# Patient Record
Sex: Female | Born: 1975 | Race: Black or African American | Hispanic: No | Marital: Married | Smoking: Never smoker
Health system: Southern US, Community
[De-identification: ages and names within clinical notes are randomized; demographics above are authoritative.]

## PROBLEM LIST (undated history)

## (undated) DIAGNOSIS — K219 Gastro-esophageal reflux disease without esophagitis: Secondary | ICD-10-CM

## (undated) DIAGNOSIS — E039 Hypothyroidism, unspecified: Secondary | ICD-10-CM

## (undated) DIAGNOSIS — E669 Obesity, unspecified: Secondary | ICD-10-CM

## (undated) HISTORY — DX: Gastro-esophageal reflux disease without esophagitis: K21.9

## (undated) HISTORY — PX: COLONOSCOPY: SHX174

## (undated) HISTORY — PX: LAPAROSCOPIC GASTRIC BANDING: SHX1100

---

## 2007-01-18 ENCOUNTER — Ambulatory Visit: Payer: Self-pay | Admitting: Family Medicine

## 2007-09-29 ENCOUNTER — Ambulatory Visit: Payer: Self-pay | Admitting: Gastroenterology

## 2007-11-03 ENCOUNTER — Ambulatory Visit: Payer: Self-pay | Admitting: Gastroenterology

## 2007-11-05 ENCOUNTER — Ambulatory Visit: Payer: Self-pay | Admitting: Gastroenterology

## 2007-11-05 LAB — CONVERTED CEMR LAB
Fecal Occult Blood: NEGATIVE
OCCULT 1: NEGATIVE
OCCULT 2: NEGATIVE
OCCULT 3: NEGATIVE
OCCULT 5: NEGATIVE

## 2007-12-27 ENCOUNTER — Ambulatory Visit: Payer: Self-pay | Admitting: Family Medicine

## 2008-02-04 DIAGNOSIS — D696 Thrombocytopenia, unspecified: Secondary | ICD-10-CM | POA: Insufficient documentation

## 2008-02-04 DIAGNOSIS — E039 Hypothyroidism, unspecified: Secondary | ICD-10-CM | POA: Insufficient documentation

## 2008-02-04 DIAGNOSIS — D649 Anemia, unspecified: Secondary | ICD-10-CM

## 2011-04-22 NOTE — Assessment & Plan Note (Signed)
[facility]                         GASTROENTEROLOGY OFFICE NOTE   NAME:Reid, Diane VEAZEY                    MRN:          [id number]  DATE:09/29/2007                            DOB:          Apr 09, 1976    REASON FOR CONSULTATION:  Anemia.   Diane Reid is a 35 year old African American female referred for  evaluation of anemia.  She is under Dr. Milta Deiters care for what I believe  is thrombocytopenia.  On August 27, 2007 her platelet count was  46,000, hemoglobin was 10, and MCV was 73.  Diane Reid denies rectal  bleeding or melena.  She had regular menstrual periods until  approximately 6 months ago.  They have regularized since starting an  oral contraceptive.  She takes occasional ibuprofen.  She denies  pyrosis, abdominal pain, or change in bowel habits.   PAST MEDICAL HISTORY:  Significant for hypothyroidism.   FAMILY HISTORY:  Noncontributory.   MEDICATIONS:  Include levothyroxine, and an oral contraceptive.   SHE HAS NO KNOWN ALLERGIES.   She neither smokes or drinks.  She is single, and works as a Armed forces operational officer.   REVIEW OF SYSTEMS:  Reviewed, and is negative.   EXAMINATION:  Pulse 80.  Blood pressure 122/80.  Weight 270.  HEENT: EOMI.  PERRLA.  Sclerae are anicteric.  Conjunctivae are pink.  NECK:  Supple without thyromegaly, adenopathy or carotid bruits.  CHEST:  Clear to auscultation and percussion without adventitious  sounds.  CARDIAC:  Regular rhythm; normal S1 S2.  There are no murmurs, gallops  or rubs.  ABDOMEN:  Bowel sounds are normoactive.  Abdomen is soft, nontender and  nondistended.  There are no abdominal masses, tenderness, splenic  enlargement or hepatomegaly.  EXTREMITIES:  Full range of motion.  No cyanosis, clubbing or edema.  RECTAL:  Deferred.   IMPRESSION:  1. Microcytic anemia.  Etiology could be from chronic GI blood loss,      menstrual blood loss, or malabsorption.  2. Thrombocytopenia.   ITP certainly is a possibility.  Details of her      workup with Dr. Welton Flakes are not known.  3. Hypothyroidism.   RECOMMENDATION:  1. Colonoscopy.  2. Serial hemoccults.  3. Consider upper endoscopy with small bowel biopsies if she is      Hemoccult, and colonoscopy is negative.     Barbette Hair. Arlyce Dice, MD,FACG  Electronically Signed    RDK/MedQ  DD: 09/29/2007  DT: 09/30/2007  Job #: [id number]   cc:   Beverely Risen, M.D.

## 2011-04-22 NOTE — Letter (Signed)
September 29, 2007    Ms. Diane Reid   RE:  [name], [name]  MRN:  [id number]  /  DOB:  [birth year]   Dear Ms. Carrasco:   It is my pleasure to have treated you recently as a new patient in my  office.  I appreciate your confidence and the opportunity to participate  in your care.   Since I do have a busy inpatient endoscopy schedule and office schedule,  my office hours vary weekly.  I am, however, available for emergency  calls every day through my office.  If I cannot promptly meet an urgent  office appointment, another one of our gastroenterologists will be able  to assist you.   My well-trained staff are prepared to help you at all times.  For  emergencies after office hours, a physician from our gastroenterology  section is always available through my 24-hour answering service.   While you are under my care, I encourage discussion of your questions  and concerns, and I will be happy to return your calls as soon as I am  available.   Once again, I welcome you as a new patient and I look forward to a happy  and healthy relationship.    Sincerely,      Barbette Hair. Arlyce Dice, MD,FACG  Electronically Signed   RDK/MedQ  DD: 09/29/2007  DT: 09/30/2007  Job #: [id number]

## 2014-01-15 ENCOUNTER — Ambulatory Visit: Payer: Self-pay | Admitting: Physician Assistant

## 2014-01-15 LAB — URINALYSIS, COMPLETE
BILIRUBIN, UR: NEGATIVE
Glucose,UR: NEGATIVE mg/dL (ref 0–75)
KETONE: NEGATIVE
NITRITE: NEGATIVE
Ph: 6 (ref 4.5–8.0)
RBC,UR: >30 /HPF (ref 0–5)
Specific Gravity: 1.02 (ref 1.003–1.030)

## 2014-01-17 LAB — URINE CULTURE

## 2015-07-11 ENCOUNTER — Encounter: Payer: Self-pay | Admitting: Gastroenterology

## 2016-01-05 ENCOUNTER — Ambulatory Visit
Admission: EM | Admit: 2016-01-05 | Discharge: 2016-01-05 | Disposition: A | Discharge status: Home / Self Care | Payer: 59 | Attending: Family Medicine | Admitting: Family Medicine

## 2016-01-05 DIAGNOSIS — J029 Acute pharyngitis, unspecified: Secondary | ICD-10-CM

## 2016-01-05 DIAGNOSIS — H109 Unspecified conjunctivitis: Secondary | ICD-10-CM

## 2016-01-05 DIAGNOSIS — J069 Acute upper respiratory infection, unspecified: Secondary | ICD-10-CM

## 2016-01-05 HISTORY — DX: Obesity, unspecified: E66.9

## 2016-01-05 HISTORY — DX: Hypothyroidism, unspecified: E03.9

## 2016-01-05 LAB — RAPID STREP SCREEN (MED CTR MEBANE ONLY): Streptococcus, Group A Screen (Direct): NEGATIVE

## 2016-01-05 MED ORDER — BENZONATATE 200 MG PO CAPS: 200.0000 mg | ORAL_CAPSULE | Freq: Three times a day (TID) | ORAL | Status: DC | PRN

## 2016-01-05 MED ORDER — GENTAMICIN SULFATE 0.3 % OP SOLN: 2.0000 [drp] | Freq: Three times a day (TID) | OPHTHALMIC | Status: DC

## 2016-01-05 NOTE — ED Provider Notes (Signed)
CSN: [id number]     Arrival date & time 01/05/16  0908 History   First MD Initiated Contact with Patient 01/05/16 1013    Nurses notes were reviewed. Chief Complaint  Patient presents with  . Conjunctivitis    Eye puffy, itchy yesterday, crusted shut this a.m. Throat pain x past few days. 3/10 pain.    Patient is here because of irritation of her left eye. She states that yesterday became red irritated inflamed so she came in to be seen. She started having a sore throat on Wednesday basically 3 days before that. And she's also had a dry cough at night.   History of hypothyroidism she does not smoke she's had gastric banding past/banding for obesity. No known history of drug allergies no sniffing family medical history or problems.    (Consider location/radiation/quality/duration/timing/severity/associated sxs/prior Treatment) Patient is a 40 y.o. female presenting with conjunctivitis and pharyngitis. The history is provided by the patient. No language interpreter was used.  Conjunctivitis This is a new problem. The current episode started yesterday. The problem occurs constantly. The problem has been gradually worsening. Pertinent negatives include no chest pain, no abdominal pain, no headaches and no shortness of breath. Nothing relieves the symptoms. She has tried nothing for the symptoms.  Sore Throat This is a new problem. The current episode started more than 2 days ago. The problem occurs constantly. The problem has not changed since onset.Pertinent negatives include no chest pain, no abdominal pain, no headaches and no shortness of breath. Nothing relieves the symptoms. She has tried nothing for the symptoms.    Past Medical History  Diagnosis Date  . Hypothyroid   . Obesity    Past Surgical History  Procedure Laterality Date  . Laparoscopic gastric banding     History reviewed. No pertinent family history. Social History  Substance Use Topics  . Smoking status: Never Smoker    . Smokeless tobacco: None  . Alcohol Use: No   OB History    No data available     Review of Systems  Respiratory: Positive for cough. Negative for shortness of breath.   Cardiovascular: Negative for chest pain.  Gastrointestinal: Negative for abdominal pain.  Neurological: Negative for headaches.  All other systems reviewed and are negative.   Allergies  Review of patient's allergies indicates no known allergies.  Home Medications   Prior to Admission medications   Medication Sig Start Date End Date Taking? Authorizing Provider  levothyroxine (SYNTHROID, LEVOTHROID) 50 MCG tablet Take 50 mcg by mouth daily before breakfast.   Yes Historical Provider, MD  norethindrone-ethinyl estradiol-iron (MICROGESTIN FE,GILDESS FE,LOESTRIN FE) 1.5-30 MG-MCG tablet Take 1 tablet by mouth daily.   Yes Historical Provider, MD  benzonatate (TESSALON) 200 MG capsule Take 1 capsule (200 mg total) by mouth 3 (three) times daily as needed for cough. 01/05/16   Frederich Cha, MD  gentamicin (GARAMYCIN) 0.3 % ophthalmic solution Place 2 drops into the left eye 3 (three) times daily. 01/05/16   Frederich Cha, MD   Meds Ordered and Administered this Visit  Medications - No data to display  BP 131/76 mmHg  Pulse 94  Temp(Src) 98.1 F (36.7 C) (Oral)  Resp 20  Ht 5\' 4"  (1.626 m)  Wt 234 lb (106.142 kg)  BMI 40.15 kg/m2  SpO2 100%  LMP 12/13/2015 No data found.   Physical Exam  ED Course  Procedures (including critical care time)  Labs Review Labs Reviewed  RAPID STREP SCREEN (NOT AT [facility])  CULTURE,  GROUP A STREP [facility])    Imaging Review No results found.   Visual Acuity Review  Right Eye Distance: 20/30 uncorrected Left Eye Distance: 20/30 uncorrected Bilateral Distance: 20/25 uncorrected  Right Eye Near:   Left Eye Near:    Bilateral Near:       Results for orders placed or performed during the hospital encounter of 01/05/16  Rapid strep screen  Result Value Ref Range    Streptococcus, Group A Screen (Direct) NEGATIVE NEGATIVE    MDM   1. Conjunctivitis of left eye   2. URI, acute   3. Pharyngitis    We'll place patient on gentamicin eyedrops 2 drops in the affected left eye 3 times a day Tessalon Perles for cough return for follow-up symptoms get worse. Note: This dictation was prepared with Dragon dictation along with smaller phrase technology. Any transcriptional errors that result from this process are unintentional.   Frederich Cha, MD 01/05/16 1032

## 2016-01-05 NOTE — Discharge Instructions (Signed)
Bacterial Conjunctivitis Bacterial conjunctivitis (commonly called pink eye) is redness, soreness, or puffiness (inflammation) of the white part of your eye. It is caused by a germ called bacteria. These germs can easily spread from person to person (contagious). Your eye often will become red or pink. Your eye may also become irritated, watery, or have a thick discharge.  HOME CARE   Apply a cool, clean washcloth over closed eyelids. Do this for 10-20 minutes, 3-4 times a day while you have pain.  Gently wipe away any fluid coming from the eye with a warm, wet washcloth or cotton ball.  Wash your hands often with soap and water. Use paper towels to dry your hands.  Do not share towels or washcloths.  Change or wash your pillowcase every day.  Do not use eye makeup until the infection is gone.  Do not use machines or drive if your vision is blurry.  Stop using contact lenses. Do not use them again until your doctor says it is okay.  Do not touch the tip of the eye drop bottle or medicine tube with your fingers when you put medicine on the eye. GET HELP RIGHT AWAY IF:   Your eye is not better after 3 days of starting your medicine.  You have a yellowish fluid coming out of the eye.  You have more pain in the eye.  Your eye redness is spreading.  Your vision becomes blurry.  You have a fever or lasting symptoms for more than 2-3 days.  You have a fever and your symptoms suddenly get worse.  You have pain in the face.  Your face gets red or puffy (swollen). MAKE SURE YOU:   Understand these instructions.  Will watch this condition.  Will get help right away if you are not doing well or get worse.   This information is not intended to replace advice given to you by your health care provider. Make sure you discuss any questions you have with your health care provider.   Document Released: 09/02/2008 Document Revised: 11/10/2012 Document Reviewed: 07/30/2012 Elsevier  Interactive Patient Education 2016 Elsevier Inc.  Cough, Adult A cough helps to clear your throat and lungs. A cough may last only 2-3 weeks (acute), or it may last longer than 8 weeks (chronic). Many different things can cause a cough. A cough may be a sign of an illness or another medical condition. HOME CARE  Pay attention to any changes in your cough.  Take medicines only as told by your doctor.  If you were prescribed an antibiotic medicine, take it as told by your doctor. Do not stop taking it even if you start to feel better.  Talk with your doctor before you try using a cough medicine.  Drink enough fluid to keep your pee (urine) clear or pale yellow.  If the air is dry, use a cold steam vaporizer or humidifier in your home.  Stay away from things that make you cough at work or at home.  If your cough is worse at night, try using extra pillows to raise your head up higher while you sleep.  Do not smoke, and try not to be around smoke. If you need help quitting, ask your doctor.  Do not have caffeine.  Do not drink alcohol.  Rest as needed. GET HELP IF:  You have new problems (symptoms).  You cough up yellow fluid (pus).  Your cough does not get better after 2-3 weeks, or your cough gets worse.  Medicine does not help your cough and you are not sleeping well.  You have pain that gets worse or pain that is not helped with medicine.  You have a fever.  You are losing weight and you do not know why.  You have night sweats. GET HELP RIGHT AWAY IF:  You cough up blood.  You have trouble breathing.  Your heartbeat is very fast.   This information is not intended to replace advice given to you by your health care provider. Make sure you discuss any questions you have with your health care provider.   Document Released: 08/07/2011 Document Revised: 08/15/2015 Document Reviewed: 01/31/2015 Elsevier Interactive Patient Education 2016 Elsevier  Inc.  Pharyngitis Pharyngitis is a sore throat (pharynx). There is redness, pain, and swelling of your throat. HOME CARE   Drink enough fluids to keep your pee (urine) clear or pale yellow.  Only take medicine as told by your doctor.  You may get sick again if you do not take medicine as told. Finish your medicines, even if you start to feel better.  Do not take aspirin.  Rest.  Rinse your mouth (gargle) with salt water ( tsp of salt per 1 qt of water) every 1-2 hours. This will help the pain.  If you are not at risk for choking, you can suck on hard candy or sore throat lozenges. GET HELP IF:  You have large, tender lumps on your neck.  You have a rash.  You cough up green, yellow-brown, or bloody spit. GET HELP RIGHT AWAY IF:   You have a stiff neck.  You drool or cannot swallow liquids.  You throw up (vomit) or are not able to keep medicine or liquids down.  You have very bad pain that does not go away with medicine.  You have problems breathing (not from a stuffy nose). MAKE SURE YOU:   Understand these instructions.  Will watch your condition.  Will get help right away if you are not doing well or get worse.   This information is not intended to replace advice given to you by your health care provider. Make sure you discuss any questions you have with your health care provider.   Document Released: 05/12/2008 Document Revised: 09/14/2013 Document Reviewed: 08/01/2013 Elsevier Interactive Patient Education [employer].

## 2016-01-08 LAB — CULTURE, GROUP A STREP (THRC)

## 2017-01-08 ENCOUNTER — Other Ambulatory Visit: Payer: Self-pay | Admitting: Nurse Practitioner

## 2017-01-08 DIAGNOSIS — Z1231 Encounter for screening mammogram for malignant neoplasm of breast: Secondary | ICD-10-CM

## 2017-03-24 ENCOUNTER — Other Ambulatory Visit: Payer: Self-pay | Admitting: Nurse Practitioner

## 2017-03-24 ENCOUNTER — Ambulatory Visit
Admission: RE | Admit: 2017-03-24 | Discharge: 2017-03-24 | Disposition: A | Discharge status: Home / Self Care | Payer: PRIVATE HEALTH INSURANCE | Source: Ambulatory Visit | Attending: Nurse Practitioner | Admitting: Nurse Practitioner

## 2017-03-24 DIAGNOSIS — Z1231 Encounter for screening mammogram for malignant neoplasm of breast: Secondary | ICD-10-CM

## 2017-11-09 ENCOUNTER — Other Ambulatory Visit: Payer: Self-pay | Admitting: Nurse Practitioner

## 2017-11-09 DIAGNOSIS — Z1231 Encounter for screening mammogram for malignant neoplasm of breast: Secondary | ICD-10-CM

## 2018-04-23 ENCOUNTER — Ambulatory Visit
Admission: RE | Admit: 2018-04-23 | Discharge: 2018-04-23 | Disposition: A | Discharge status: Home / Self Care | Payer: No Typology Code available for payment source | Source: Ambulatory Visit | Attending: Nurse Practitioner | Admitting: Nurse Practitioner

## 2018-04-23 DIAGNOSIS — Z1231 Encounter for screening mammogram for malignant neoplasm of breast: Secondary | ICD-10-CM

## 2019-05-24 ENCOUNTER — Other Ambulatory Visit: Payer: Self-pay | Admitting: Nurse Practitioner

## 2019-05-24 DIAGNOSIS — Z1231 Encounter for screening mammogram for malignant neoplasm of breast: Secondary | ICD-10-CM

## 2019-07-06 ENCOUNTER — Ambulatory Visit
Admission: RE | Admit: 2019-07-06 | Discharge: 2019-07-06 | Disposition: A | Discharge status: Home / Self Care | Payer: 59 | Source: Ambulatory Visit | Attending: Nurse Practitioner | Admitting: Nurse Practitioner

## 2019-07-06 DIAGNOSIS — Z1231 Encounter for screening mammogram for malignant neoplasm of breast: Secondary | ICD-10-CM | POA: Insufficient documentation

## 2019-10-05 DIAGNOSIS — D259 Leiomyoma of uterus, unspecified: Secondary | ICD-10-CM | POA: Insufficient documentation

## 2020-06-22 ENCOUNTER — Other Ambulatory Visit: Payer: Self-pay | Admitting: Nurse Practitioner

## 2020-06-22 DIAGNOSIS — Z1231 Encounter for screening mammogram for malignant neoplasm of breast: Secondary | ICD-10-CM

## 2020-07-16 ENCOUNTER — Other Ambulatory Visit: Payer: Self-pay

## 2020-07-16 ENCOUNTER — Ambulatory Visit
Admission: RE | Admit: 2020-07-16 | Discharge: 2020-07-16 | Disposition: A | Discharge status: Home / Self Care | Payer: 59 | Source: Ambulatory Visit | Attending: Nurse Practitioner | Admitting: Nurse Practitioner

## 2020-07-16 DIAGNOSIS — Z1231 Encounter for screening mammogram for malignant neoplasm of breast: Secondary | ICD-10-CM | POA: Diagnosis not present

## 2021-06-12 ENCOUNTER — Other Ambulatory Visit: Payer: Self-pay | Admitting: Nurse Practitioner

## 2021-06-12 DIAGNOSIS — Z1231 Encounter for screening mammogram for malignant neoplasm of breast: Secondary | ICD-10-CM

## 2021-09-25 ENCOUNTER — Other Ambulatory Visit: Payer: Self-pay

## 2021-09-25 ENCOUNTER — Ambulatory Visit
Admission: RE | Admit: 2021-09-25 | Discharge: 2021-09-25 | Disposition: A | Discharge status: Home / Self Care | Payer: 59 | Source: Ambulatory Visit | Attending: Nurse Practitioner | Admitting: Nurse Practitioner

## 2021-09-25 DIAGNOSIS — Z1231 Encounter for screening mammogram for malignant neoplasm of breast: Secondary | ICD-10-CM | POA: Diagnosis not present

## 2022-08-27 ENCOUNTER — Other Ambulatory Visit: Payer: Self-pay | Admitting: Nurse Practitioner

## 2022-08-27 DIAGNOSIS — Z1231 Encounter for screening mammogram for malignant neoplasm of breast: Secondary | ICD-10-CM

## 2022-09-12 ENCOUNTER — Encounter: Payer: Self-pay | Admitting: Gastroenterology

## 2022-09-26 ENCOUNTER — Ambulatory Visit
Admission: RE | Admit: 2022-09-26 | Discharge: 2022-09-26 | Disposition: A | Discharge status: Home / Self Care | Payer: Commercial Managed Care - PPO | Source: Ambulatory Visit | Attending: Nurse Practitioner | Admitting: Nurse Practitioner

## 2022-09-26 DIAGNOSIS — Z1231 Encounter for screening mammogram for malignant neoplasm of breast: Secondary | ICD-10-CM | POA: Insufficient documentation

## 2022-11-12 ENCOUNTER — Ambulatory Visit (AMBULATORY_SURGERY_CENTER): Payer: Commercial Managed Care - PPO | Admitting: *Deleted

## 2022-11-12 VITALS — Ht 64.0 in | Wt 233.2 lb

## 2022-11-12 DIAGNOSIS — Z1211 Encounter for screening for malignant neoplasm of colon: Secondary | ICD-10-CM

## 2022-11-12 MED ORDER — NA SULFATE-K SULFATE-MG SULF 17.5-3.13-1.6 GM/177ML PO SOLN: 1.0000 | Freq: Once | ORAL | 0 refills | Status: AC

## 2022-11-12 NOTE — Progress Notes (Signed)
No egg or soy allergy known to patient  No issues known to pt with past sedation with any surgeries or procedures Patient denies ever being told they had issues or difficulty with intubation  No FH of Malignant Hyperthermia Pt is not on diet pills Pt is not on  home 02  Pt is not on blood thinners  Pt denies issues with constipation  Pt encouraged to use to use Singlecare or Goodrx to reduce cost  Patient's chart reviewed by John Nulty CNRA prior to previsit and patient appropriate for the [facility].  Previsit completed and red dot placed by patient's name on their procedure day (on provider's schedule).     

## 2022-12-05 ENCOUNTER — Encounter: Payer: 59 | Admitting: Gastroenterology

## 2022-12-22 IMAGING — MG MM DIGITAL SCREENING BILAT W/ TOMO AND CAD
6 of 12 series · 6 of 36 positions shown · non-contrast
Comparison: Previous exam(s).

CLINICAL DATA: Screening.

EXAM:
DIGITAL SCREENING BILATERAL MAMMOGRAM WITH TOMOSYNTHESIS AND CAD
TECHNIQUE: Bilateral screening digital craniocaudal and mediolateral oblique
mammograms were obtained. Bilateral screening digital breast
tomosynthesis was performed. The images were evaluated with
computer-aided detection.

[R XCCL synth-2D]
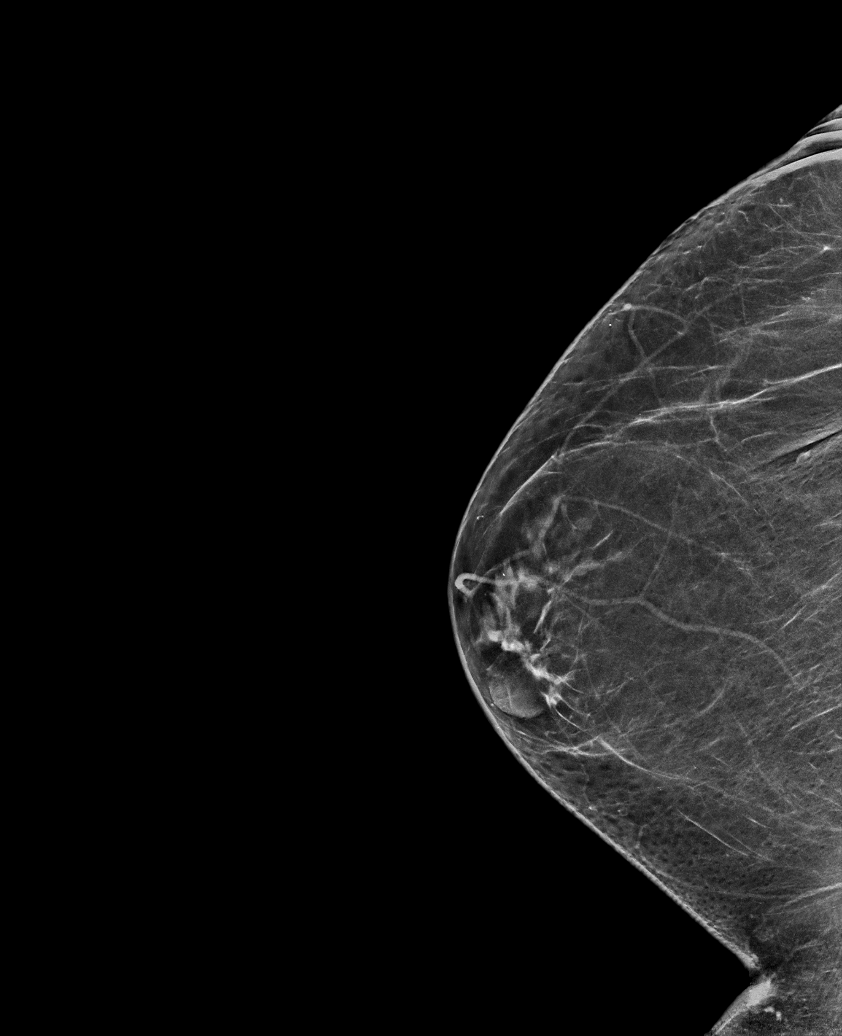

[R CC synth-2D]
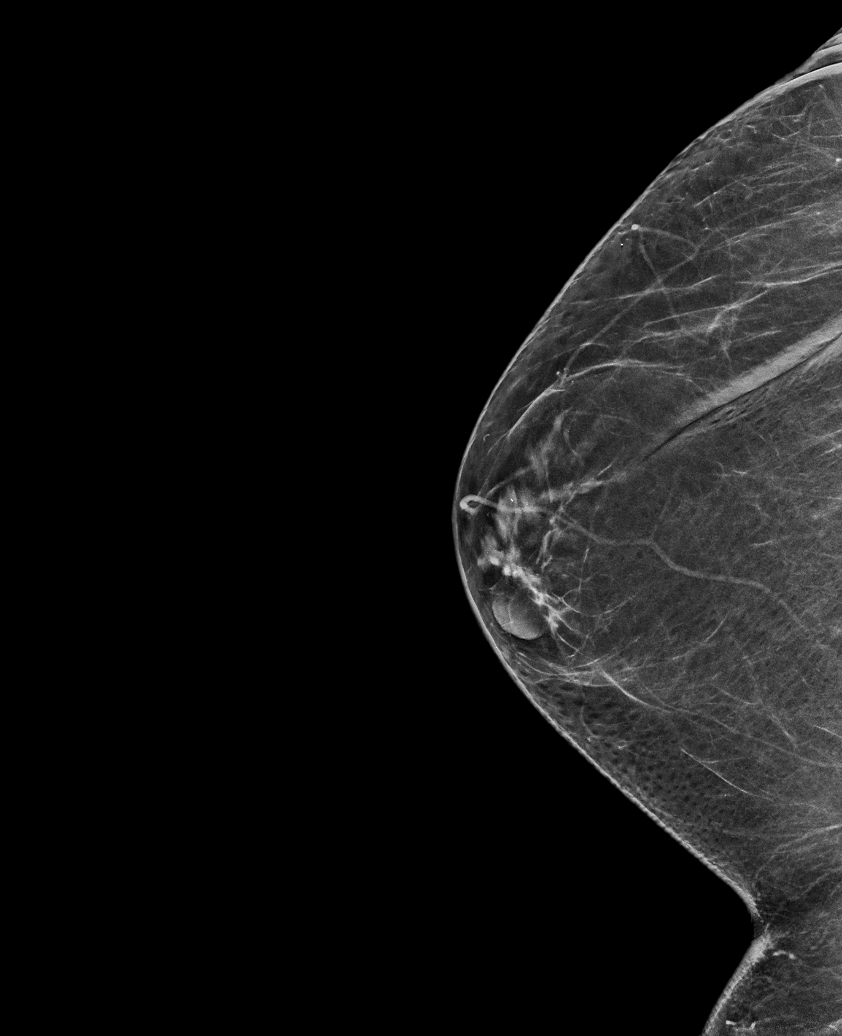

[R MLO synth-2D]
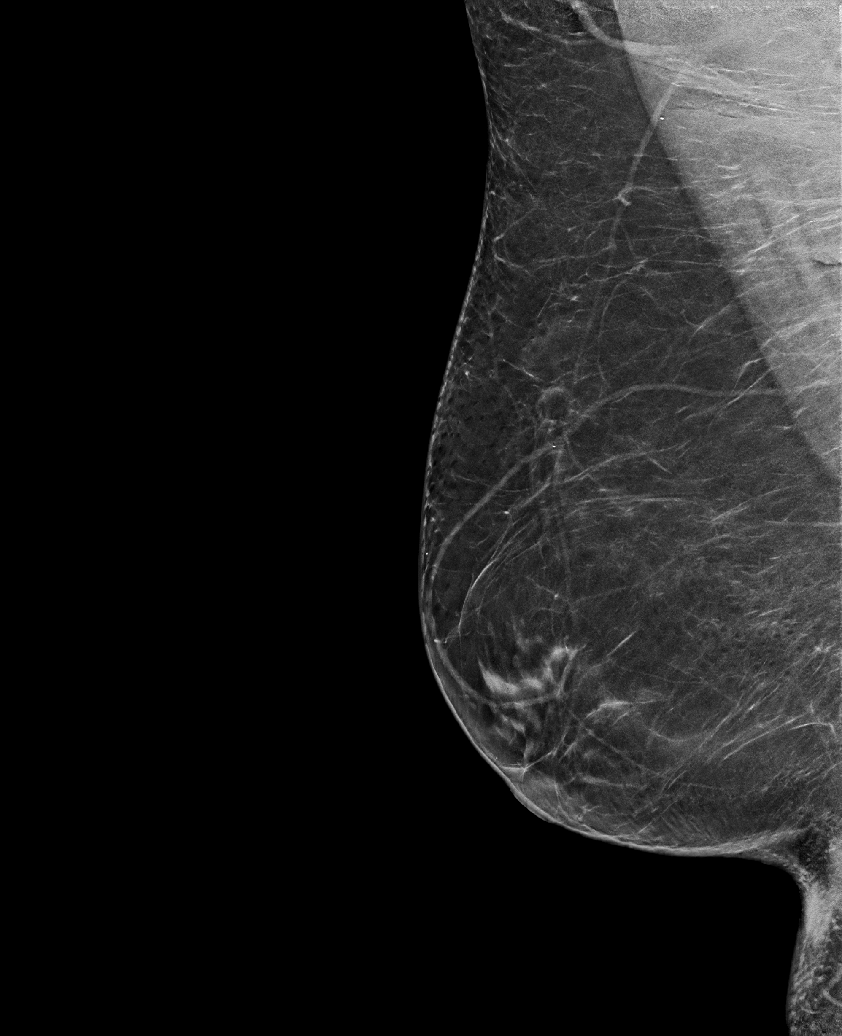

[L CC synth-2D]
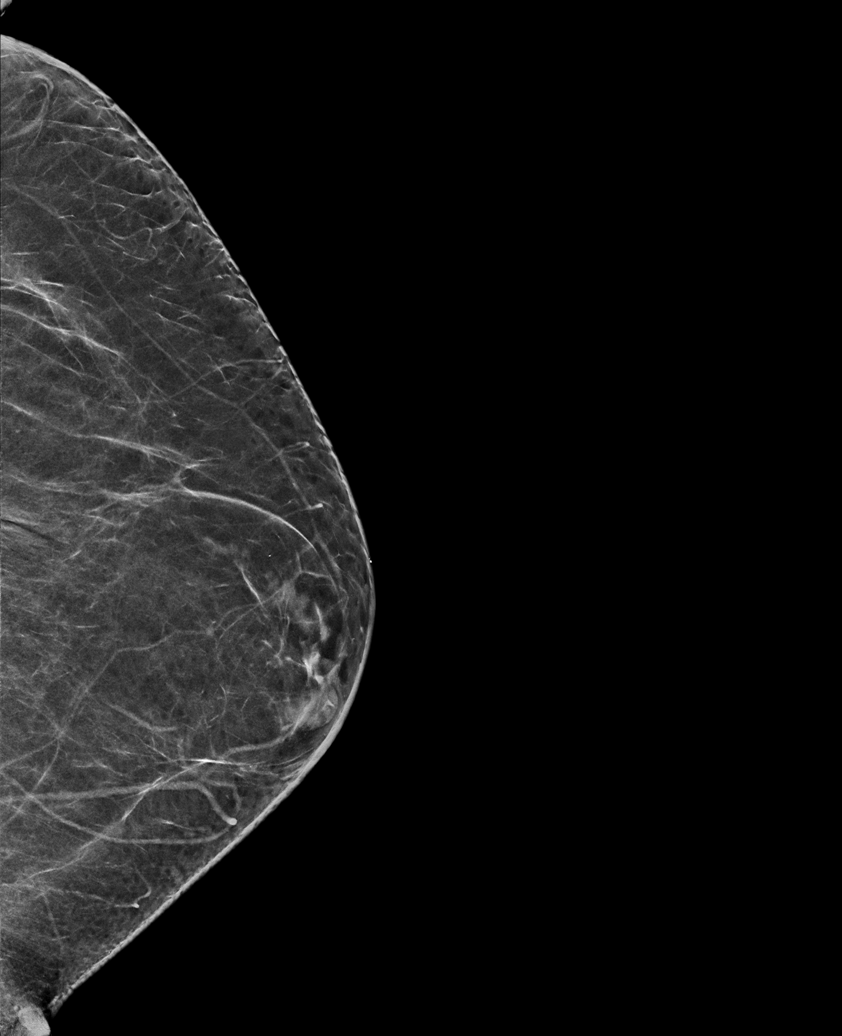

[L MLO synth-2D (1 of 2)]
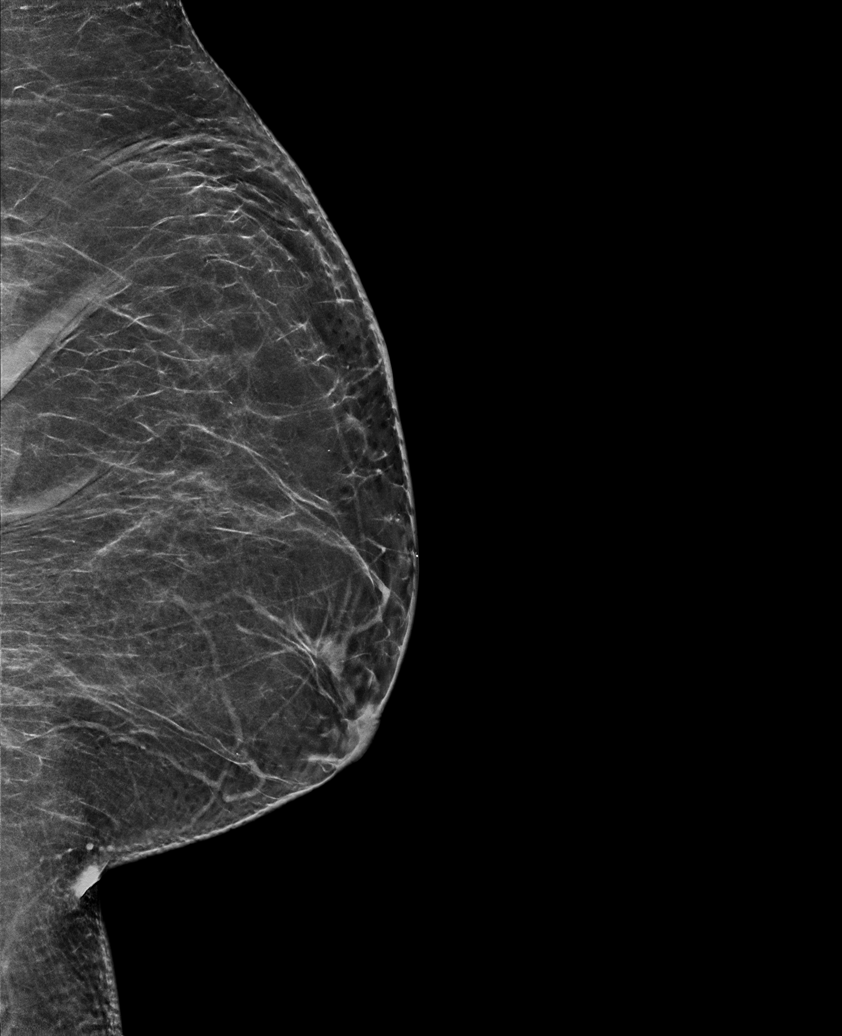

[L MLO synth-2D (2 of 2)]
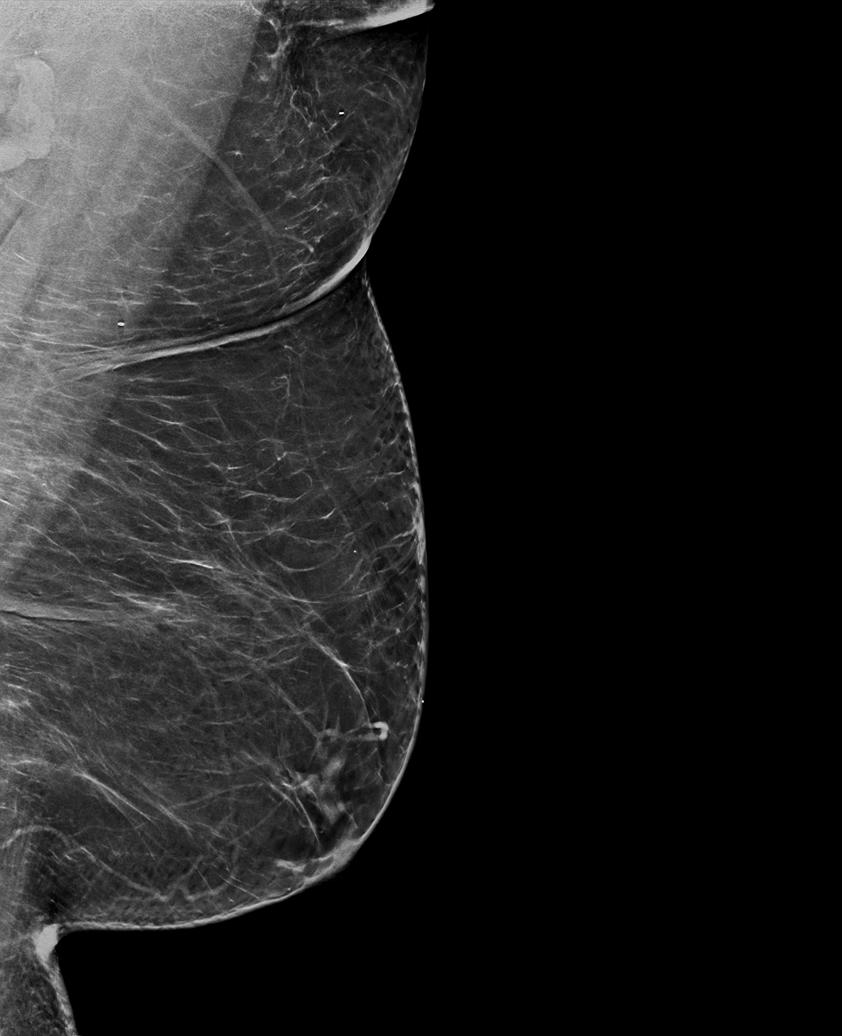

[6 of 36 positions shown; findings below may reference images not displayed]

ACR Breast Density Category b: There are scattered areas of
fibroglandular density.
FINDINGS: There are no findings suspicious for malignancy.
IMPRESSION: No mammographic evidence of malignancy. A result letter of this
screening mammogram will be mailed directly to the patient.

RECOMMENDATION:
Screening mammogram in one year. (Code:51-O-LD2)

BI-RADS CATEGORY  1: Negative.

## 2023-02-02 ENCOUNTER — Ambulatory Visit (AMBULATORY_SURGERY_CENTER): Payer: Commercial Managed Care - PPO

## 2023-02-02 VITALS — Ht 64.0 in | Wt 235.0 lb

## 2023-02-02 DIAGNOSIS — Z1211 Encounter for screening for malignant neoplasm of colon: Secondary | ICD-10-CM

## 2023-02-02 DIAGNOSIS — R7302 Impaired glucose tolerance (oral): Secondary | ICD-10-CM | POA: Insufficient documentation

## 2023-02-02 MED ORDER — NA SULFATE-K SULFATE-MG SULF 17.5-3.13-1.6 GM/177ML PO SOLN: 1.0000 | Freq: Once | ORAL | 0 refills | Status: AC

## 2023-02-02 NOTE — Progress Notes (Signed)

## 2023-03-02 ENCOUNTER — Encounter: Payer: Self-pay | Admitting: Gastroenterology

## 2023-03-02 ENCOUNTER — Ambulatory Visit (AMBULATORY_SURGERY_CENTER): Payer: Commercial Managed Care - PPO | Admitting: Gastroenterology

## 2023-03-02 VITALS — BP 148/92 | HR 77 | Temp 98.0°F | Resp 18 | Ht 64.0 in | Wt 231.6 lb

## 2023-03-02 DIAGNOSIS — D129 Benign neoplasm of anus and anal canal: Secondary | ICD-10-CM | POA: Diagnosis not present

## 2023-03-02 DIAGNOSIS — D128 Benign neoplasm of rectum: Secondary | ICD-10-CM | POA: Diagnosis not present

## 2023-03-02 DIAGNOSIS — Z1211 Encounter for screening for malignant neoplasm of colon: Secondary | ICD-10-CM | POA: Diagnosis present

## 2023-03-02 DIAGNOSIS — K621 Rectal polyp: Secondary | ICD-10-CM | POA: Diagnosis not present

## 2023-03-02 MED ORDER — SODIUM CHLORIDE 0.9 % IV SOLN: 500.0000 mL | Freq: Once | INTRAVENOUS | Status: DC

## 2023-03-02 NOTE — Patient Instructions (Addendum)
- Resume previous diet.                           - Repeat colonoscopy in 5-10 years for surveillance                               based on pathology results.                           - Return to GI office PRN.  Resume previous medications.  1 polyp removed and sent to pathology.  Await results for final recommendations.   Handouts on findings given to patient.  (Polyps)  YOU HAD AN ENDOSCOPIC PROCEDURE TODAY AT [facility]:   Refer to the procedure report that was given to you for any specific questions about what was found during the examination.  If the procedure report does not answer your questions, please call your gastroenterologist to clarify.  If you requested that your care partner not be given the details of your procedure findings, then the procedure report has been included in a sealed envelope for you to review at your convenience later.  YOU SHOULD EXPECT: Some feelings of bloating in the abdomen. Passage of more gas than usual.  Walking can help get rid of the air that was put into your GI tract during the procedure and reduce the bloating. If you had a lower endoscopy (such as a colonoscopy or flexible sigmoidoscopy) you may notice spotting of blood in your stool or on the toilet paper. If you underwent a bowel prep for your procedure, you may not have a normal bowel movement for a few days.  Please Note:  You might notice some irritation and congestion in your nose or some drainage.  This is from the oxygen used during your procedure.  There is no need for concern and it should clear up in a day or so.  SYMPTOMS TO REPORT IMMEDIATELY:  Following lower endoscopy (colonoscopy or flexible sigmoidoscopy):  Excessive amounts of blood in the stool  Significant tenderness or worsening of abdominal pains  Swelling of the abdomen that is new, acute  Fever of 100F or higher   For urgent or emergent issues, a gastroenterologist can be  reached at any hour by calling [phone]. Do not use MyChart messaging for urgent concerns.    DIET:  We do recommend a small meal at first, but then you may proceed to your regular diet.  Drink plenty of fluids but you should avoid alcoholic beverages for 24 hours.  ACTIVITY:  You should plan to take it easy for the rest of today and you should NOT DRIVE or use heavy machinery until tomorrow (because of the sedation medicines used during the test).    FOLLOW UP: Our staff will call the number listed on your records the next business day following your procedure.  We will call around 7:15- 8:00 am to check on you and address any questions or concerns that you may have regarding the information given to you following your procedure. If we do not reach you, we will leave a message.     If any biopsies were taken you will be contacted by phone or by letter within the next 1-3 weeks.  Please call us at [phone] if you have not heard about the biopsies in 3 weeks.  SIGNATURES/CONFIDENTIALITY: You and/or your care partner have signed paperwork which will be entered into your electronic medical record.  These signatures attest to the fact that that the information above on your After Visit Summary has been reviewed and is understood.  Full responsibility of the confidentiality of this discharge information lies with you and/or your care-partner.

## 2023-03-02 NOTE — Progress Notes (Signed)
Pt resting comfortably. VSS. Airway intact. SBAR complete to RN. All questions answered.   

## 2023-03-02 NOTE — Progress Notes (Signed)
GASTROENTEROLOGY PROCEDURE H&P NOTE   Primary Care Physician: Tonye Pearson, FNP    Reason for Procedure:  Colon Cancer screening  Plan:    Colonoscopy  Patient is appropriate for endoscopic procedure(s) in the ambulatory ([place] [facility]) setting.  The nature of the procedure, as well as the risks, benefits, and alternatives were carefully and thoroughly reviewed with the patient. Ample time for discussion and questions allowed. The patient understood, was satisfied, and agreed to proceed.     HPI: Diane Reid is a 47 y.o. female who presents for colonoscopy for routine Colon Cancer screening.  No active GI symptoms.   Family history notable for maternal grandmother with colon cancer, along with mother and maternal uncle with colon polyps.  Colonoscopy in 10/2007 performed for iron deficiency and was normal.  Past Medical History:  Diagnosis Date   GERD (gastroesophageal reflux disease)    Hypothyroid    Obesity     Past Surgical History:  Procedure Laterality Date   COLONOSCOPY     LAPAROSCOPIC GASTRIC BANDING      Prior to Admission medications   Medication Sig Start Date End Date Taking? Authorizing Provider  levothyroxine (SYNTHROID, LEVOTHROID) 50 MCG tablet Take 50 mcg by mouth daily before breakfast.   Yes [provider]  JUNEL 1.5/30 1.5-30 MG-MCG tablet Take 1 tablet by mouth daily. 01/30/23   [provider]  norethindrone-ethinyl estradiol-iron (MICROGESTIN FE,GILDESS FE,LOESTRIN FE) 1.5-30 MG-MCG tablet Take 1 tablet by mouth daily.    [provider]    Current Outpatient Medications  Medication Sig Dispense Refill   levothyroxine (SYNTHROID, LEVOTHROID) 50 MCG tablet Take 50 mcg by mouth daily before breakfast.     JUNEL 1.5/30 1.5-30 MG-MCG tablet Take 1 tablet by mouth daily.     norethindrone-ethinyl estradiol-iron (MICROGESTIN FE,GILDESS FE,LOESTRIN FE) 1.5-30 MG-MCG tablet Take 1 tablet by mouth daily.     Current  Facility-Administered Medications  Medication Dose Route Frequency Provider Last Rate Last Admin   0.9 %  sodium chloride infusion  500 mL Intravenous Once Maylynn Orzechowski V, DO        Allergies as of 03/02/2023   (No Known Allergies)    Family History  Problem Relation Age of Onset   Colon polyps Mother    Colon polyps Maternal Uncle    Breast cancer Paternal Aunt 20   Colon cancer Maternal Grandmother    Breast cancer Cousin 28   Esophageal cancer Neg Hx    Stomach cancer Neg Hx    Rectal cancer Neg Hx     Social History   Socioeconomic History   Marital status: Married    Spouse name: Not on file   Number of children: Not on file   Years of education: Not on file   Highest education level: Not on file  Occupational History   Not on file  Tobacco Use   Smoking status: Never   Smokeless tobacco: Never  Vaping Use   Vaping Use: Never used  Substance and Sexual Activity   Alcohol use: No   Drug use: No   Sexual activity: Not on file  Other Topics Concern   Not on file  Social History Narrative   Not on file   Social Determinants of Health   Financial Resource Strain: Not on file  Food Insecurity: Not on file  Transportation Needs: Not on file  Physical Activity: Not on file  Stress: Not on file  Social Connections: Not on file  Intimate Partner Violence:  Not on file    Physical Exam: Vital signs in last 24 hours: @BP  131/63   Pulse 93   Temp 98 F (36.7 C) (Temporal)   Ht 5\' 4"  (1.626 m)   Wt 231 lb 9.6 oz (105.1 kg)   SpO2 100%   BMI 39.75 kg/m  GEN: NAD EYE: Sclerae anicteric ENT: MMM CV: Non-tachycardic Pulm: CTA b/l GI: Soft, NT/ND NEURO:  Alert & Oriented x 3   Gerrit Heck, DO [facility]   03/02/2023 2:27 PM

## 2023-03-02 NOTE — Progress Notes (Signed)
VS completed by EC.   Pt's states no medical or surgical changes since previsit or office visit.  

## 2023-03-02 NOTE — Op Note (Signed)
[facility] Patient Name: Diane Reid Procedure Date: 03/02/2023 2:32 PM MRN: [id number] Endoscopist: Gerrit Heck , MD, [id number] Age: 47 Referring MD:  Date of Birth: 02/06/1976 Gender: Female Account #: [id number] Procedure:                Colonoscopy Indications:              Screening for colorectal malignant neoplasm, Colon                            cancer screening in patient at increased risk:                            Family history of 1st-degree relative with colon                            polyps, 2nd degree relative with colon cancer and                            polyps                           Family history notable for maternal grandmother                            with colon cancer, along with mother and maternal                            uncle with colon polyps.                           Colonoscopy in 10/2007 performed for iron                            deficiency and was normal Medicines:                Monitored Anesthesia Care Procedure:                Pre-Anesthesia Assessment:                           - Prior to the procedure, a History and Physical                            was performed, and patient medications and                            allergies were reviewed. The patient's tolerance of                            previous anesthesia was also reviewed. The risks                            and benefits of the procedure and the sedation                            options and  risks were discussed with the patient.                            All questions were answered, and informed consent                            was obtained. Prior Anticoagulants: The patient has                            taken no anticoagulant or antiplatelet agents. ASA                            Grade Assessment: II - A patient with mild systemic                            disease. After reviewing the risks and benefits,                            the  patient was deemed in satisfactory condition to                            undergo the procedure.                           After obtaining informed consent, the colonoscope                            was passed under direct vision. Throughout the                            procedure, the patient's blood pressure, pulse, and                            oxygen saturations were monitored continuously. The                            CF [id number] was introduced through the anus                            and advanced to the the cecum, identified by                            appendiceal orifice and ileocecal valve. The                            colonoscopy was performed without difficulty. The                            patient tolerated the procedure well. The quality                            of the bowel preparation was good. The ileocecal  valve, appendiceal orifice, and rectum were                            photographed. Scope In: 2:35:12 PM Scope Out: 2:57:36 PM Scope Withdrawal Time: 0 hours 12 minutes 0 seconds  Total Procedure Duration: 0 hours 22 minutes 24 seconds  Findings:                 The perianal and digital rectal examinations were                            normal.                           A 3 mm polyp was found in the rectum. The polyp was                            sessile. The polyp was removed with a cold snare.                            Resection and retrieval were complete. Estimated                            blood loss was minimal.                           The remainder of the colon is otherwise normal.                           The retroflexed view of the distal rectum and anal                            verge was normal and showed no anal or rectal                            abnormalities. Complications:            No immediate complications. Estimated Blood Loss:     Estimated blood loss was minimal. Impression:                - One 3 mm polyp in the rectum, removed with a cold                            snare. Resected and retrieved.                           - The remainder of the colon is otherwise normal.                           - The distal rectum and anal verge are normal on                            retroflexion view.                           - The GI  Genius (intelligent endoscopy module),                            computer-aided polyp detection system powered by AI                            was utilized to detect colorectal polyps through                            enhanced visualization during colonoscopy. Recommendation:           - Patient has a contact number available for                            emergencies. The signs and symptoms of potential                            delayed complications were discussed with the                            patient. Return to normal activities tomorrow.                            Written discharge instructions were provided to the                            patient.                           - Resume previous diet.                           - Continue present medications.                           - Await pathology results.                           - Repeat colonoscopy in 5-10 years for surveillance                            based on pathology results.                           - Return to GI office PRN. Gerrit Heck, MD 03/02/2023 3:05:02 PM

## 2023-03-02 NOTE — Progress Notes (Signed)
Called to room to assist during endoscopic procedure.  Patient ID and intended procedure confirmed with present staff. Received instructions for my participation in the procedure from the performing physician.  

## 2023-03-03 ENCOUNTER — Telehealth: Payer: Self-pay

## 2023-03-03 NOTE — Telephone Encounter (Signed)
  Follow up Call-     03/02/2023    1:38 PM  Call back number  Post procedure Call Back phone  # [phone]  Permission to leave phone message Yes     Patient questions:  Do you have a fever, pain , or abdominal swelling? No. Pain Score  0 *  Have you tolerated food without any problems? Yes.    Have you been able to return to your normal activities? Yes.    Do you have any questions about your discharge instructions: Diet   No. Medications  No. Follow up visit  No.  Do you have questions or concerns about your Care? No.  Actions: * If pain score is 4 or above: No action needed, pain <4.

## 2023-03-19 ENCOUNTER — Encounter: Payer: Self-pay | Admitting: Gastroenterology

## 2023-09-18 ENCOUNTER — Other Ambulatory Visit: Payer: Self-pay | Admitting: Family

## 2023-09-18 DIAGNOSIS — Z1231 Encounter for screening mammogram for malignant neoplasm of breast: Secondary | ICD-10-CM

## 2023-10-08 ENCOUNTER — Ambulatory Visit
Admission: RE | Admit: 2023-10-08 | Discharge: 2023-10-08 | Disposition: A | Discharge status: Home / Self Care | Payer: Commercial Managed Care - PPO | Source: Ambulatory Visit | Attending: Family | Admitting: Family

## 2023-10-08 DIAGNOSIS — Z1231 Encounter for screening mammogram for malignant neoplasm of breast: Secondary | ICD-10-CM | POA: Insufficient documentation

## 2024-09-06 ENCOUNTER — Other Ambulatory Visit: Payer: Self-pay | Admitting: Family

## 2024-09-06 DIAGNOSIS — Z1231 Encounter for screening mammogram for malignant neoplasm of breast: Secondary | ICD-10-CM

## 2024-10-10 ENCOUNTER — Encounter

## 2024-11-14 ENCOUNTER — Encounter

## 2024-12-16 ENCOUNTER — Ambulatory Visit
Admission: RE | Admit: 2024-12-16 | Discharge: 2024-12-16 | Disposition: A | Source: Ambulatory Visit | Attending: Family | Admitting: Family

## 2024-12-16 DIAGNOSIS — Z1231 Encounter for screening mammogram for malignant neoplasm of breast: Secondary | ICD-10-CM | POA: Diagnosis present
